# Patient Record
Sex: Female | Born: 1950
Health system: Southern US, Community
[De-identification: ages and names within clinical notes are randomized; demographics above are authoritative.]

## PROBLEM LIST (undated history)

## (undated) DIAGNOSIS — F319 Bipolar disorder, unspecified: Secondary | ICD-10-CM

---

## 2007-11-20 ENCOUNTER — Emergency Department (HOSPITAL_COMMUNITY): Admission: EM | Admit: 2007-11-20 | Discharge: 2007-11-20 | Payer: Self-pay | Admitting: Emergency Medicine

## 2011-09-16 LAB — CBC
Hemoglobin: 14.4
MCHC: 34.4
MCV: 85.3
Platelets: 354
RDW: 12.8

## 2011-09-16 LAB — BASIC METABOLIC PANEL
Chloride: 106
GFR calc Af Amer: >60
GFR calc non Af Amer: >60
Glucose, Bld: 117 — ABNORMAL HIGH
Sodium: 142

## 2012-08-12 ENCOUNTER — Other Ambulatory Visit: Payer: Self-pay | Admitting: Radiology

## 2015-11-09 DIAGNOSIS — Z79899 Other long term (current) drug therapy: Secondary | ICD-10-CM | POA: Diagnosis not present

## 2015-11-21 DIAGNOSIS — S62502A Fracture of unspecified phalanx of left thumb, initial encounter for closed fracture: Secondary | ICD-10-CM | POA: Diagnosis not present

## 2015-12-06 DIAGNOSIS — S62515A Nondisplaced fracture of proximal phalanx of left thumb, initial encounter for closed fracture: Secondary | ICD-10-CM | POA: Diagnosis not present

## 2016-04-03 DIAGNOSIS — J029 Acute pharyngitis, unspecified: Secondary | ICD-10-CM | POA: Diagnosis not present

## 2016-05-29 DIAGNOSIS — M85852 Other specified disorders of bone density and structure, left thigh: Secondary | ICD-10-CM | POA: Diagnosis not present

## 2016-05-29 DIAGNOSIS — Z1231 Encounter for screening mammogram for malignant neoplasm of breast: Secondary | ICD-10-CM | POA: Diagnosis not present

## 2016-05-29 DIAGNOSIS — M810 Age-related osteoporosis without current pathological fracture: Secondary | ICD-10-CM | POA: Diagnosis not present

## 2016-05-29 DIAGNOSIS — M85851 Other specified disorders of bone density and structure, right thigh: Secondary | ICD-10-CM | POA: Diagnosis not present

## 2017-01-10 DIAGNOSIS — H353131 Nonexudative age-related macular degeneration, bilateral, early dry stage: Secondary | ICD-10-CM | POA: Diagnosis not present

## 2017-01-10 DIAGNOSIS — H2513 Age-related nuclear cataract, bilateral: Secondary | ICD-10-CM | POA: Diagnosis not present

## 2017-01-10 DIAGNOSIS — H524 Presbyopia: Secondary | ICD-10-CM | POA: Diagnosis not present

## 2017-03-18 DIAGNOSIS — Z79899 Other long term (current) drug therapy: Secondary | ICD-10-CM | POA: Diagnosis not present

## 2017-05-30 DIAGNOSIS — Z1231 Encounter for screening mammogram for malignant neoplasm of breast: Secondary | ICD-10-CM | POA: Diagnosis not present

## 2017-06-19 DIAGNOSIS — F3173 Bipolar disorder, in partial remission, most recent episode manic: Secondary | ICD-10-CM | POA: Diagnosis not present

## 2017-09-04 DIAGNOSIS — F3173 Bipolar disorder, in partial remission, most recent episode manic: Secondary | ICD-10-CM | POA: Diagnosis not present

## 2017-10-24 DIAGNOSIS — F3174 Bipolar disorder, in full remission, most recent episode manic: Secondary | ICD-10-CM | POA: Diagnosis not present

## 2017-11-13 DIAGNOSIS — F3174 Bipolar disorder, in full remission, most recent episode manic: Secondary | ICD-10-CM | POA: Diagnosis not present

## 2018-01-20 DIAGNOSIS — F3174 Bipolar disorder, in full remission, most recent episode manic: Secondary | ICD-10-CM | POA: Diagnosis not present

## 2018-03-19 DIAGNOSIS — F3174 Bipolar disorder, in full remission, most recent episode manic: Secondary | ICD-10-CM | POA: Diagnosis not present

## 2018-05-14 DIAGNOSIS — Z79899 Other long term (current) drug therapy: Secondary | ICD-10-CM | POA: Diagnosis not present

## 2018-05-19 DIAGNOSIS — F3174 Bipolar disorder, in full remission, most recent episode manic: Secondary | ICD-10-CM | POA: Diagnosis not present

## 2018-07-14 DIAGNOSIS — Z1231 Encounter for screening mammogram for malignant neoplasm of breast: Secondary | ICD-10-CM | POA: Diagnosis not present

## 2018-08-04 DIAGNOSIS — F3174 Bipolar disorder, in full remission, most recent episode manic: Secondary | ICD-10-CM | POA: Diagnosis not present

## 2018-10-01 DIAGNOSIS — F3174 Bipolar disorder, in full remission, most recent episode manic: Secondary | ICD-10-CM | POA: Diagnosis not present

## 2018-10-20 DIAGNOSIS — F3174 Bipolar disorder, in full remission, most recent episode manic: Secondary | ICD-10-CM | POA: Diagnosis not present

## 2018-12-22 ENCOUNTER — Emergency Department (HOSPITAL_COMMUNITY)
Admission: EM | Admit: 2018-12-22 | Discharge: 2018-12-22 | Disposition: A | Discharge status: Home / Self Care | Payer: Medicare Other | Attending: Emergency Medicine | Admitting: Emergency Medicine

## 2018-12-22 ENCOUNTER — Other Ambulatory Visit: Payer: Self-pay

## 2018-12-22 ENCOUNTER — Encounter (HOSPITAL_COMMUNITY): Payer: Self-pay

## 2018-12-22 ENCOUNTER — Emergency Department (HOSPITAL_COMMUNITY): Payer: Medicare Other

## 2018-12-22 DIAGNOSIS — R519 Headache, unspecified: Secondary | ICD-10-CM

## 2018-12-22 DIAGNOSIS — S060X0A Concussion without loss of consciousness, initial encounter: Secondary | ICD-10-CM

## 2018-12-22 DIAGNOSIS — R51 Headache: Secondary | ICD-10-CM | POA: Diagnosis not present

## 2018-12-22 DIAGNOSIS — R42 Dizziness and giddiness: Secondary | ICD-10-CM | POA: Diagnosis present

## 2018-12-22 LAB — CBC
HEMATOCRIT: 46 % (ref 36.0–46.0)
HEMOGLOBIN: 14.4 g/dL (ref 12.0–15.0)
MCH: 28.4 pg (ref 26.0–34.0)
MCHC: 31.3 g/dL (ref 30.0–36.0)
MCV: 90.7 fL (ref 80.0–100.0)
Platelets: 334 10*3/uL (ref 150–400)
RBC: 5.07 MIL/uL (ref 3.87–5.11)
RDW: 12.1 % (ref 11.5–15.5)
WBC: 10.8 10*3/uL — ABNORMAL HIGH (ref 4.0–10.5)
nRBC: 0 % (ref 0.0–0.2)

## 2018-12-22 LAB — BASIC METABOLIC PANEL
Anion gap: 10 (ref 5–15)
BUN: 8 mg/dL (ref 8–23)
CHLORIDE: 104 mmol/L (ref 98–111)
CO2: 24 mmol/L (ref 22–32)
Calcium: 9.3 mg/dL (ref 8.9–10.3)
Creatinine, Ser: 0.91 mg/dL (ref 0.44–1.00)
GFR calc Af Amer: >60 mL/min (ref >60)
GFR calc non Af Amer: >60 mL/min (ref >60)
GLUCOSE: 104 mg/dL — AB (ref 70–99)
POTASSIUM: 4.2 mmol/L (ref 3.5–5.1)
Sodium: 138 mmol/L (ref 135–145)

## 2018-12-22 NOTE — ED Notes (Signed)
Pt verbalized understanding of d/c instructions and has no further questions, VSS, NAD.  

## 2018-12-22 NOTE — ED Provider Notes (Signed)
[facility] EMERGENCY DEPARTMENT Provider Note   CSN: [id number] Arrival date & time: 12/22/18  4098     History   Chief Complaint Chief Complaint  Patient presents with  . Dizziness  . Fall    HPI [facility] is a 68 y.o. female.  HPI   68 year old female for fall, headache and dizziness.  Reports approximately 3 to 4 weeks ago, she had a fall, hitting the back of her head.  Reports that since that time she had continuing headaches.  Approximately 1 week ago, she tripped over a car seat, fell again hitting her head.  Reports she is had worsening headaches are daily, as well as dizziness.  Describes the dizziness as lightheadedness.  Reports she has been trying to drink less, specifically drinking less sodas.  Denies any black or bloody stools, vomiting, diarrhea, cough, chest pain, shortness of breath, numbness, weakness, facial droop, change in vision.  Headache is located in the back of the head with radiation forward.  Is moderate at this time. Improves with tylenol at home. Not worsened by bright lights or loud sounds.   History reviewed. No pertinent past medical history.  There are no active problems to display for this patient.   History reviewed. No pertinent surgical history.   OB History   No obstetric history on file.      Home Medications    Prior to Admission medications   Not on File    Family History History reviewed. No pertinent family history.  Social History Social History   Tobacco Use  . Smoking status: Never Smoker  Substance Use Topics  . Alcohol use: Yes    Comment: occasional wine  . Drug use: Never     Allergies   Patient has no known allergies.   Review of Systems Review of Systems  Constitutional: Negative for fever.  HENT: Negative for sore throat.   Eyes: Negative for visual disturbance.  Respiratory: Negative for cough and shortness of breath.   Cardiovascular: Negative for chest pain.    Gastrointestinal: Negative for abdominal pain, nausea and vomiting.  Genitourinary: Negative for difficulty urinating and dysuria.  Musculoskeletal: Negative for back pain and neck pain.  Skin: Negative for rash.  Neurological: Positive for dizziness, light-headedness and headaches. Negative for syncope, facial asymmetry, speech difficulty, weakness and numbness.     Physical Exam Updated Vital Signs BP 119/70   Pulse 88   Temp 98.2 F (36.8 C) (Oral)   Resp 19   SpO2 93%   Physical Exam Vitals signs and nursing note reviewed.  Constitutional:      General: She is not in acute distress.    Appearance: She is well-developed. She is not diaphoretic.  HENT:     Head: Normocephalic and atraumatic.  Eyes:     General: No visual field deficit.    Conjunctiva/sclera: Conjunctivae normal.  Neck:     Musculoskeletal: Normal range of motion.  Cardiovascular:     Rate and Rhythm: Normal rate and regular rhythm.     Heart sounds: Normal heart sounds. No murmur. No friction rub. No gallop.   Pulmonary:     Effort: Pulmonary effort is normal. No respiratory distress.     Breath sounds: Normal breath sounds. No wheezing or rales.  Abdominal:     General: There is no distension.     Palpations: Abdomen is soft.     Tenderness: There is no abdominal tenderness. There is no guarding.  Musculoskeletal:  General: No tenderness.  Skin:    General: Skin is warm and dry.     Findings: No erythema or rash.  Neurological:     Mental Status: She is alert and oriented to person, place, and time.     GCS: GCS eye subscore is 4. GCS verbal subscore is 5. GCS motor subscore is 6.     Cranial Nerves: No cranial nerve deficit, dysarthria or facial asymmetry.     Sensory: Sensation is intact.     Motor: Motor function is intact. No weakness or pronator drift.     Coordination: Coordination is intact. Coordination normal. Finger-Nose-Finger Test normal.      ED Treatments / Results   Labs (all labs ordered are listed, but only abnormal results are displayed) Labs Reviewed  BASIC METABOLIC PANEL - Abnormal; Notable for the following components:      Result Value   Glucose, Bld 104 (*)    All other components within normal limits  CBC - Abnormal; Notable for the following components:   WBC 10.8 (*)    All other components within normal limits    EKG EKG Interpretation  Date/Time:  Tuesday December 22 2018 09:53:18 EST Ventricular Rate:  97 PR Interval:    QRS Duration: 87 QT Interval:  353 QTC Calculation: 449 R Axis:   -30 Text Interpretation:  Sinus rhythm Left axis deviation Low voltage, precordial leads Abnormal R-wave progression, late transition No previous ECGs available Confirmed by Alvira MondaySchlossman, Adeyemi Hamad ([id number]) on 12/22/2018 1:39:52 PM   Radiology Ct Head Wo Contrast  Result Date: 12/22/2018 CLINICAL DATA:  Headaches since falling 3 months ago. EXAM: CT HEAD WITHOUT CONTRAST TECHNIQUE: Contiguous axial images were obtained from the base of the skull through the vertex without intravenous contrast. COMPARISON:  CT head 11/20/2007. FINDINGS: Brain: There is no evidence of acute intracranial hemorrhage, mass lesion, brain edema or extra-axial fluid collection. The ventricles and subarachnoid spaces are appropriately sized for age. There is no CT evidence of acute cortical infarction. Vascular:  No hyperdense vessel identified. Skull: Negative for fracture or focal lesion. Mild calvarial hyperostosis. Sinuses/Orbits: The visualized paranasal sinuses and mastoid air cells are clear. No orbital abnormalities are seen. Other: None. IMPRESSION: Stable negative noncontrast head CT. Electronically Signed   By: Carey BullocksWilliam  Veazey M.D.   On: 12/22/2018 13:17    Procedures Procedures (including critical care time)  Medications Ordered in ED Medications - No data to display   Initial Impression / Assessment and Plan / ED Course  I have reviewed the triage vital signs and  the nursing notes.  Pertinent labs & imaging results that were available during my care of the patient were reviewed by me and considered in my medical decision making (see chart for details).    68 year old female presents with concern for headache and dizziness following head trauma both 3 to 4 weeks ago 1 week ago.  Regarding dizziness, describes as a lightheadedness.  EKG shows no sign of arrhythmia, no prolonged QTC, or other acute changes.  Labs show no significant electrolyte abnormalities.  No anemia.  Denies acute illness, low suspicion for sepsis as etiology of lightheadedness.  She has a normal neurologic exam, including normal coordination, medical decision for CVA as etiology of dizziness.  Described as lightheadedness that began after she hit her head.  CT head was done which showed no evidence of intracranial bleeding.  Symptoms may be consistent with concussion, and dehydration in the setting of decreased fluid intake may also contribute  to dizziness.  Recommend continued supportive care and follow-up with primary care physician.   Final Clinical Impressions(s) / ED Diagnoses   Final diagnoses:  Acute nonintractable headache, unspecified headache type  Concussion without loss of consciousness, initial encounter    ED Discharge Orders    None       Alvira MondaySchlossman, Brok Stocking, MD 12/22/18 2143

## 2018-12-22 NOTE — ED Triage Notes (Signed)
Pt endorses multiple mechanical falls in the last 2 months, last one was 1 week ago, hit head both times and since pt is having ha's and dizziness. Not on thinners. No neuro deficits. No syncope.

## 2019-01-02 ENCOUNTER — Emergency Department (HOSPITAL_COMMUNITY)
Admission: EM | Admit: 2019-01-02 | Discharge: 2019-01-02 | Disposition: A | Discharge status: Home / Self Care | Payer: Medicare Other | Attending: Emergency Medicine | Admitting: Emergency Medicine

## 2019-01-02 ENCOUNTER — Encounter (HOSPITAL_COMMUNITY): Payer: Self-pay | Admitting: Emergency Medicine

## 2019-01-02 ENCOUNTER — Emergency Department (HOSPITAL_COMMUNITY): Payer: Medicare Other

## 2019-01-02 DIAGNOSIS — Y939 Activity, unspecified: Secondary | ICD-10-CM | POA: Diagnosis not present

## 2019-01-02 DIAGNOSIS — R031 Nonspecific low blood-pressure reading: Secondary | ICD-10-CM | POA: Diagnosis not present

## 2019-01-02 DIAGNOSIS — Y999 Unspecified external cause status: Secondary | ICD-10-CM | POA: Insufficient documentation

## 2019-01-02 DIAGNOSIS — R0789 Other chest pain: Secondary | ICD-10-CM | POA: Diagnosis not present

## 2019-01-02 DIAGNOSIS — S3992XA Unspecified injury of lower back, initial encounter: Secondary | ICD-10-CM | POA: Diagnosis not present

## 2019-01-02 DIAGNOSIS — S299XXA Unspecified injury of thorax, initial encounter: Secondary | ICD-10-CM | POA: Diagnosis not present

## 2019-01-02 DIAGNOSIS — S3991XA Unspecified injury of abdomen, initial encounter: Secondary | ICD-10-CM | POA: Diagnosis not present

## 2019-01-02 DIAGNOSIS — S20211A Contusion of right front wall of thorax, initial encounter: Secondary | ICD-10-CM | POA: Insufficient documentation

## 2019-01-02 DIAGNOSIS — Y929 Unspecified place or not applicable: Secondary | ICD-10-CM | POA: Diagnosis not present

## 2019-01-02 DIAGNOSIS — R52 Pain, unspecified: Secondary | ICD-10-CM | POA: Diagnosis not present

## 2019-01-02 DIAGNOSIS — R079 Chest pain, unspecified: Secondary | ICD-10-CM | POA: Diagnosis not present

## 2019-01-02 DIAGNOSIS — M545 Low back pain: Secondary | ICD-10-CM | POA: Diagnosis not present

## 2019-01-02 HISTORY — DX: Bipolar disorder, unspecified: F31.9

## 2019-01-02 LAB — CBC WITH DIFFERENTIAL/PLATELET
ABS IMMATURE GRANULOCYTES: 0.13 10*3/uL — AB (ref 0.00–0.07)
BASOS PCT: 0 %
Basophils Absolute: 0 10*3/uL (ref 0.0–0.1)
EOS ABS: 0 10*3/uL (ref 0.0–0.5)
Eosinophils Relative: 0 %
HEMATOCRIT: 39.8 % (ref 36.0–46.0)
Hemoglobin: 12.5 g/dL (ref 12.0–15.0)
IMMATURE GRANULOCYTES: 1 %
Lymphocytes Relative: 7 %
Lymphs Abs: 1.2 10*3/uL (ref 0.7–4.0)
MCH: 29.2 pg (ref 26.0–34.0)
MCHC: 31.4 g/dL (ref 30.0–36.0)
MCV: 93 fL (ref 80.0–100.0)
Monocytes Absolute: 1.1 10*3/uL — ABNORMAL HIGH (ref 0.1–1.0)
Monocytes Relative: 6 %
NEUTROS ABS: 15.2 10*3/uL — AB (ref 1.7–7.7)
NEUTROS PCT: 86 %
PLATELETS: 252 10*3/uL (ref 150–400)
RBC: 4.28 MIL/uL (ref 3.87–5.11)
RDW: 12.1 % (ref 11.5–15.5)
WBC: 17.6 10*3/uL — AB (ref 4.0–10.5)
nRBC: 0 % (ref 0.0–0.2)

## 2019-01-02 LAB — COMPREHENSIVE METABOLIC PANEL
ALT: 37 U/L (ref 0–44)
ANION GAP: 9 (ref 5–15)
AST: 79 U/L — ABNORMAL HIGH (ref 15–41)
Albumin: 3.1 g/dL — ABNORMAL LOW (ref 3.5–5.0)
Alkaline Phosphatase: 83 U/L (ref 38–126)
BILIRUBIN TOTAL: 0.5 mg/dL (ref 0.3–1.2)
BUN: 6 mg/dL — ABNORMAL LOW (ref 8–23)
CHLORIDE: 103 mmol/L (ref 98–111)
CO2: 24 mmol/L (ref 22–32)
Calcium: 8.5 mg/dL — ABNORMAL LOW (ref 8.9–10.3)
Creatinine, Ser: 0.89 mg/dL (ref 0.44–1.00)
Glucose, Bld: 149 mg/dL — ABNORMAL HIGH (ref 70–99)
Potassium: 3.7 mmol/L (ref 3.5–5.1)
Sodium: 136 mmol/L (ref 135–145)
TOTAL PROTEIN: 6 g/dL — AB (ref 6.5–8.1)

## 2019-01-02 LAB — LIPASE, BLOOD: Lipase: 29 U/L (ref 11–51)

## 2019-01-02 LAB — I-STAT CREATININE, ED: CREATININE: 0.8 mg/dL (ref 0.44–1.00)

## 2019-01-02 MED ORDER — MORPHINE SULFATE 15 MG PO TABS: 15.0000 mg | ORAL_TABLET | ORAL | 0 refills | Status: AC | PRN

## 2019-01-02 MED ORDER — DICLOFENAC SODIUM 1 % TD GEL
4.0000 g | Freq: Once | TRANSDERMAL | Status: AC
  Administered 2019-01-02: 4 g via TOPICAL
  Filled 2019-01-02: qty 100

## 2019-01-02 MED ORDER — SODIUM CHLORIDE 0.9 % IV BOLUS
1000.0000 mL | Freq: Once | INTRAVENOUS | Status: AC
  Administered 2019-01-02: 1000 mL via INTRAVENOUS

## 2019-01-02 MED ORDER — IOHEXOL 300 MG/ML  SOLN
100.0000 mL | Freq: Once | INTRAMUSCULAR | Status: AC | PRN
  Administered 2019-01-02: 100 mL via INTRAVENOUS

## 2019-01-02 MED ORDER — DICLOFENAC SODIUM 1 % TD GEL: 4.0000 g | Freq: Four times a day (QID) | TRANSDERMAL | 0 refills | Status: AC

## 2019-01-02 MED ORDER — ACETAMINOPHEN 500 MG PO TABS
1000.0000 mg | ORAL_TABLET | Freq: Once | ORAL | Status: AC
  Administered 2019-01-02: 1000 mg via ORAL
  Filled 2019-01-02: qty 2

## 2019-01-02 NOTE — Discharge Instructions (Signed)
Take tylenol 1000mg(2 extra strength) four times a day.  ° °Then take the pain medicine if you feel like you need it. Narcotics do not help with the pain, they only make you care about it less.  You can become addicted to this, people may break into your house to steal it.  It will constipate you.  If you drive under the influence of this medicine you can get a DUI.   ° °
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
## 2019-01-02 NOTE — ED Notes (Signed)
Patient ambulated in the hallway with no new concerns. Patient was steady on her feet while ambulating.

## 2019-01-02 NOTE — ED Notes (Signed)
Pt verbalized understanding of d/c instructions and has no further questions, VSS, NAD. Pt instructed as to how to use voltaren gel and mophine.

## 2019-01-02 NOTE — ED Notes (Signed)
Pt's BP 86/64, Dr Adela Lank notified and will order CT scans for pt. Pt placed back in gown and moved into Resus Room.

## 2019-01-02 NOTE — ED Notes (Signed)
Pt in restroom sitting up on commode trying to put clothes on. Pt became dizzy and weak. Pt assisted back to stretcher. BP 90s/50s. Dr Adela LankFloyd notified and at bedside. Pt given 500ml NS bolus by this RN.

## 2019-01-02 NOTE — ED Notes (Signed)
 NS bolus complete and pt feels better, less dizzy.
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
## 2019-01-12 DIAGNOSIS — N6489 Other specified disorders of breast: Secondary | ICD-10-CM | POA: Diagnosis not present

## 2019-01-27 DIAGNOSIS — F3174 Bipolar disorder, in full remission, most recent episode manic: Secondary | ICD-10-CM | POA: Diagnosis not present

## 2019-02-02 DIAGNOSIS — F3174 Bipolar disorder, in full remission, most recent episode manic: Secondary | ICD-10-CM | POA: Diagnosis not present

## 2019-02-09 DIAGNOSIS — F3174 Bipolar disorder, in full remission, most recent episode manic: Secondary | ICD-10-CM | POA: Diagnosis not present

## 2019-02-19 DIAGNOSIS — M546 Pain in thoracic spine: Secondary | ICD-10-CM | POA: Diagnosis not present

## 2019-04-27 DIAGNOSIS — H524 Presbyopia: Secondary | ICD-10-CM | POA: Diagnosis not present

## 2019-04-27 DIAGNOSIS — H353132 Nonexudative age-related macular degeneration, bilateral, intermediate dry stage: Secondary | ICD-10-CM | POA: Diagnosis not present

## 2019-04-27 DIAGNOSIS — H2513 Age-related nuclear cataract, bilateral: Secondary | ICD-10-CM | POA: Diagnosis not present
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
## 2019-05-11 DIAGNOSIS — F3174 Bipolar disorder, in full remission, most recent episode manic: Secondary | ICD-10-CM | POA: Diagnosis not present

## 2019-06-24 DIAGNOSIS — Z79899 Other long term (current) drug therapy: Secondary | ICD-10-CM | POA: Diagnosis not present

## 2019-07-30 DIAGNOSIS — F3174 Bipolar disorder, in full remission, most recent episode manic: Secondary | ICD-10-CM | POA: Diagnosis not present

## 2019-10-01 DIAGNOSIS — F3174 Bipolar disorder, in full remission, most recent episode manic: Secondary | ICD-10-CM | POA: Diagnosis not present

## 2019-10-29 DIAGNOSIS — H35373 Puckering of macula, bilateral: Secondary | ICD-10-CM | POA: Diagnosis not present

## 2019-12-14 DIAGNOSIS — F3176 Bipolar disorder, in full remission, most recent episode depressed: Secondary | ICD-10-CM | POA: Diagnosis not present

## 2020-01-18 ENCOUNTER — Telehealth: Payer: Self-pay

## 2020-01-18 NOTE — Telephone Encounter (Signed)
Per initial encounter, "Pt returning call for vaccine app. Please call pt back. Thanks!"; contacted pt to schedule appt; the pt says that she was contacted pm 01/17/20; she would like a Friday afternoon appt; pt offered and accepted appt at [facility] 01/22/20 at 1415; she verbalized understanding.

## 2020-01-22 ENCOUNTER — Ambulatory Visit

## 2020-01-28 ENCOUNTER — Ambulatory Visit

## 2020-01-30 ENCOUNTER — Ambulatory Visit: Payer: Medicare Other | Attending: Internal Medicine

## 2020-01-30 DIAGNOSIS — Z23 Encounter for immunization: Secondary | ICD-10-CM | POA: Insufficient documentation

## 2020-01-30 NOTE — Progress Notes (Signed)
   Covid-19 Vaccination Clinic  Name:  Icis Budreau    MRN: [id number] DOB: 12-18-1950  01/30/2020  Ms. Bruschi was observed post Covid-19 immunization for 15 minutes without incidence. She was provided with Vaccine Information Sheet and instruction to access the V-Safe system.   Ms. Buckingham was instructed to call 911 with any severe reactions post vaccine: Marland Kitchen Difficulty breathing  . Swelling of your face and throat  . A fast heartbeat  . A bad rash all over your body  . Dizziness and weakness    Immunizations Administered    Name Date Dose VIS Date Route   Pfizer COVID-19 Vaccine 01/30/2020  9:22 AM 0.3 mL 11/19/2019 Intramuscular   Manufacturer: [employer], [employer]   Lot: [id number]   NDC: [address]-8

## 2020-02-29 ENCOUNTER — Ambulatory Visit: Payer: Medicare Other | Attending: Internal Medicine

## 2020-02-29 DIAGNOSIS — Z23 Encounter for immunization: Secondary | ICD-10-CM

## 2020-02-29 NOTE — Progress Notes (Signed)
   Covid-19 Vaccination Clinic  Name:  [facility]    MRN: [id number] DOB: 09-17-1951  02/29/2020  [facility] was observed post Covid-19 immunization for 15 minutes without incident. She was provided with Vaccine Information Sheet and instruction to access the V-Safe system.   [facility] was instructed to call 911 with any severe reactions post vaccine: Marland Kitchen Difficulty breathing  . Swelling of face and throat  . A fast heartbeat  . A bad rash all over body  . Dizziness and weakness   Immunizations Administered    Name Date Dose VIS Date Route   Pfizer COVID-19 Vaccine 02/29/2020  4:45 PM 0.3 mL 11/19/2019 Intramuscular   Manufacturer: [employer], [employer]   Lot: [id number]   NDC: [address]-6

## 2020-03-17 DIAGNOSIS — F3176 Bipolar disorder, in full remission, most recent episode depressed: Secondary | ICD-10-CM | POA: Diagnosis not present

## 2020-05-10 DIAGNOSIS — F3176 Bipolar disorder, in full remission, most recent episode depressed: Secondary | ICD-10-CM | POA: Diagnosis not present

## 2020-05-15 DIAGNOSIS — H35371 Puckering of macula, right eye: Secondary | ICD-10-CM | POA: Diagnosis not present

## 2020-05-15 DIAGNOSIS — H2513 Age-related nuclear cataract, bilateral: Secondary | ICD-10-CM | POA: Diagnosis not present

## 2020-06-13 DIAGNOSIS — F3176 Bipolar disorder, in full remission, most recent episode depressed: Secondary | ICD-10-CM | POA: Diagnosis not present

## 2020-06-23 DIAGNOSIS — Z79899 Other long term (current) drug therapy: Secondary | ICD-10-CM | POA: Diagnosis not present

## 2020-07-11 DIAGNOSIS — F3176 Bipolar disorder, in full remission, most recent episode depressed: Secondary | ICD-10-CM | POA: Diagnosis not present

## 2020-08-22 DIAGNOSIS — F3176 Bipolar disorder, in full remission, most recent episode depressed: Secondary | ICD-10-CM | POA: Diagnosis not present

## 2020-10-25 DIAGNOSIS — F3176 Bipolar disorder, in full remission, most recent episode depressed: Secondary | ICD-10-CM | POA: Diagnosis not present

## 2020-11-23 DIAGNOSIS — H2513 Age-related nuclear cataract, bilateral: Secondary | ICD-10-CM | POA: Diagnosis not present

## 2020-11-23 DIAGNOSIS — H35371 Puckering of macula, right eye: Secondary | ICD-10-CM | POA: Diagnosis not present

## 2020-12-22 DIAGNOSIS — F3176 Bipolar disorder, in full remission, most recent episode depressed: Secondary | ICD-10-CM | POA: Diagnosis not present

## 2021-03-01 DIAGNOSIS — F3175 Bipolar disorder, in partial remission, most recent episode depressed: Secondary | ICD-10-CM | POA: Diagnosis not present

## 2021-03-01 DIAGNOSIS — F3173 Bipolar disorder, in partial remission, most recent episode manic: Secondary | ICD-10-CM | POA: Diagnosis not present

## 2021-03-01 DIAGNOSIS — F3174 Bipolar disorder, in full remission, most recent episode manic: Secondary | ICD-10-CM | POA: Diagnosis not present

## 2021-03-01 DIAGNOSIS — F3176 Bipolar disorder, in full remission, most recent episode depressed: Secondary | ICD-10-CM | POA: Diagnosis not present

## 2021-05-01 DIAGNOSIS — F3176 Bipolar disorder, in full remission, most recent episode depressed: Secondary | ICD-10-CM | POA: Diagnosis not present

## 2021-05-01 DIAGNOSIS — F3174 Bipolar disorder, in full remission, most recent episode manic: Secondary | ICD-10-CM | POA: Diagnosis not present

## 2021-05-01 DIAGNOSIS — F3175 Bipolar disorder, in partial remission, most recent episode depressed: Secondary | ICD-10-CM | POA: Diagnosis not present

## 2021-05-25 DIAGNOSIS — H2513 Age-related nuclear cataract, bilateral: Secondary | ICD-10-CM | POA: Diagnosis not present

## 2021-05-25 DIAGNOSIS — H5203 Hypermetropia, bilateral: Secondary | ICD-10-CM | POA: Diagnosis not present

## 2021-06-25 DIAGNOSIS — F3174 Bipolar disorder, in full remission, most recent episode manic: Secondary | ICD-10-CM | POA: Diagnosis not present

## 2021-06-25 DIAGNOSIS — F3176 Bipolar disorder, in full remission, most recent episode depressed: Secondary | ICD-10-CM | POA: Diagnosis not present

## 2021-06-25 DIAGNOSIS — F3175 Bipolar disorder, in partial remission, most recent episode depressed: Secondary | ICD-10-CM | POA: Diagnosis not present

## 2021-07-10 DIAGNOSIS — Z79899 Other long term (current) drug therapy: Secondary | ICD-10-CM | POA: Diagnosis not present

## 2021-08-27 DIAGNOSIS — F3176 Bipolar disorder, in full remission, most recent episode depressed: Secondary | ICD-10-CM | POA: Diagnosis not present

## 2021-08-27 DIAGNOSIS — F3175 Bipolar disorder, in partial remission, most recent episode depressed: Secondary | ICD-10-CM | POA: Diagnosis not present

## 2021-08-27 DIAGNOSIS — F3174 Bipolar disorder, in full remission, most recent episode manic: Secondary | ICD-10-CM | POA: Diagnosis not present

## 2021-09-11 DIAGNOSIS — Z1231 Encounter for screening mammogram for malignant neoplasm of breast: Secondary | ICD-10-CM | POA: Diagnosis not present

## 2021-09-24 DIAGNOSIS — R921 Mammographic calcification found on diagnostic imaging of breast: Secondary | ICD-10-CM | POA: Diagnosis not present

## 2021-09-24 DIAGNOSIS — R928 Other abnormal and inconclusive findings on diagnostic imaging of breast: Secondary | ICD-10-CM | POA: Diagnosis not present

## 2021-10-24 DIAGNOSIS — F3174 Bipolar disorder, in full remission, most recent episode manic: Secondary | ICD-10-CM | POA: Diagnosis not present

## 2021-10-24 DIAGNOSIS — F3175 Bipolar disorder, in partial remission, most recent episode depressed: Secondary | ICD-10-CM | POA: Diagnosis not present

## 2021-10-24 DIAGNOSIS — F3176 Bipolar disorder, in full remission, most recent episode depressed: Secondary | ICD-10-CM | POA: Diagnosis not present

## 2021-12-19 DIAGNOSIS — F3174 Bipolar disorder, in full remission, most recent episode manic: Secondary | ICD-10-CM | POA: Diagnosis not present

## 2021-12-19 DIAGNOSIS — F3176 Bipolar disorder, in full remission, most recent episode depressed: Secondary | ICD-10-CM | POA: Diagnosis not present

## 2021-12-19 DIAGNOSIS — F3175 Bipolar disorder, in partial remission, most recent episode depressed: Secondary | ICD-10-CM | POA: Diagnosis not present

## 2021-12-20 DIAGNOSIS — Z0000 Encounter for general adult medical examination without abnormal findings: Secondary | ICD-10-CM | POA: Diagnosis not present

## 2021-12-20 DIAGNOSIS — F3177 Bipolar disorder, in partial remission, most recent episode mixed: Secondary | ICD-10-CM | POA: Diagnosis not present

## 2021-12-20 DIAGNOSIS — Z780 Asymptomatic menopausal state: Secondary | ICD-10-CM | POA: Diagnosis not present

## 2021-12-20 DIAGNOSIS — Z136 Encounter for screening for cardiovascular disorders: Secondary | ICD-10-CM | POA: Diagnosis not present

## 2021-12-20 DIAGNOSIS — Z9012 Acquired absence of left breast and nipple: Secondary | ICD-10-CM | POA: Diagnosis not present

## 2021-12-20 DIAGNOSIS — Z808 Family history of malignant neoplasm of other organs or systems: Secondary | ICD-10-CM | POA: Diagnosis not present

## 2021-12-31 ENCOUNTER — Other Ambulatory Visit: Payer: Self-pay | Admitting: Family Medicine

## 2021-12-31 DIAGNOSIS — Z780 Asymptomatic menopausal state: Secondary | ICD-10-CM

## 2022-02-13 DIAGNOSIS — F3176 Bipolar disorder, in full remission, most recent episode depressed: Secondary | ICD-10-CM | POA: Diagnosis not present

## 2022-02-13 DIAGNOSIS — F3175 Bipolar disorder, in partial remission, most recent episode depressed: Secondary | ICD-10-CM | POA: Diagnosis not present

## 2022-02-13 DIAGNOSIS — F3174 Bipolar disorder, in full remission, most recent episode manic: Secondary | ICD-10-CM | POA: Diagnosis not present

## 2022-02-26 DIAGNOSIS — R051 Acute cough: Secondary | ICD-10-CM | POA: Diagnosis not present

## 2022-03-18 ENCOUNTER — Ambulatory Visit (HOSPITAL_COMMUNITY)
Admission: RE | Admit: 2022-03-18 | Discharge: 2022-03-18 | Disposition: A | Discharge status: Home / Self Care | Payer: Medicare Other | Attending: Psychiatry | Admitting: Psychiatry

## 2022-03-18 NOTE — H&P (Signed)
Behavioral Health Medical Screening Exam ? ?Adriana Edwards is a 71 y.o. female widow who presented voluntarily as a walk-in accompanied by one of her grand daughter to [facility] for evaluation for worsening loneliness and anxiety. Patient has a past psychiatric history of Bipolar I. Patient lives alone and and receives frequent visits from grandchildren and great grandchildren. ? ?Patient reported that she has been experiencing loneliness and anxiety for the past 3 to 4 weeks in the context of failure to take her prescribed Aripiprazole and Lamotrigine x 2 months and missing her husband who had been deceased for the past 23 years. Patient endorsed anxiety and rated as "8" and depression and rated as "3" on a scale of 0 to 10. Endorsed good appetite and sleeping for at least 4 to 5 hours per night. Reported auditory hallucination like hearing things like voices trying to hurt her, specifically sending laser beam to hit her. Denied suicidal ideation, homicidal ideation, or visual hallucination. Denied self injurious behavior now or in the past. Denied any alcohol, drug or tobacco use or dependence. Currently seeing Dr. Dicky Doe, a psychiatrist at the [facility] who manages her medications. At presentation patient still had her full bottle of her 30-day prescribed medications from January 2023. Denied family history of mental health illness, history of trauma or abuse, access to fire arms at home. Denied self isolation, crying spells, hopelessness, worthlessness and anhedonia. ? ?On assessment today, patient was examined in the assessment room siting on a chair. Chart reviewed and findings shared with the treatment team and discussed with Dr. Lucianne Muss. On encounter, patient is alert and oriented to person, place, time and situation. Fairly maintained eye contact with the provider. Affect  appropriate and congruent and mood anxious and somehow depressed. Thought process coherent and linear and thought content logical and within normal  limit. Speech with normal pattern and volume. Shy when discussed the medications she was not taking. Granddaughter stated that patient does not like to take her medications, however, added that she will check on patient frequently to make sure that she takes her medications as ordered. Encouraged to take her medications as ordered by her physician and to scheduled an appointment with the psychiatrist and therapist to discuss her concerns. As per my evaluation, patient did not meet the criteria for psychiatric in-patient admission and above recommendations and emergency numbers for community crises centers were provided to patient and grand daughter. They left the [facility] without any incident. ? ?Total Time spent with patient: 45 minutes ? ?Psychiatric Specialty Exam: ? ?Presentation  ?General Appearance: Appropriate for Environment; Casual; Fairly Groomed ? ?Eye Contact:Fair ? ?Speech:Clear and Coherent; Normal Rate ? ?Speech Volume:Normal ? ?Handedness:Right ? ?Mood and Affect  ?Mood:Anxious; Depressed ? ?Affect:Appropriate; Congruent ? ?Thought Process  ?Thought Processes:Coherent; Linear ? ?Descriptions of Associations:Intact ? ?Orientation:Full (Time, Place and Person) ? ?Thought Content:Logical; WDL ? ?History of Schizophrenia/Schizoaffective disorder:No data recorded ?Duration of Psychotic Symptoms:No data recorded ?Hallucinations:Hallucinations: Auditory ?Description of Auditory Hallucinations: Hearing voices trying to hurt me by sending and laser towards me. ? ?Ideas of Reference:Paranoia ? ?Suicidal Thoughts:Suicidal Thoughts: No ? ?Homicidal Thoughts:Homicidal Thoughts: No ? ?Sensorium  ?Memory:Immediate Fair; Recent Fair; Remote Fair ? ?Judgment:Fair ? ?Insight:Fair ? ?Executive Functions  ?Concentration:Fair ? ?Attention Span:Fair ? ?Recall:Fair ? ?Fund of Knowledge:Fair ? ?Language:Good ? ?Psychomotor Activity  ?Psychomotor Activity:Psychomotor Activity: Normal ? ?Assets  ?Assets:Communication Skills;  Physical Health; Social Support ? ?Sleep  ?Sleep:Sleep: Fair ?Number of Hours of Sleep: 4 ? ?Physical Exam: ?Physical Exam ?Vitals reviewed.  ?Constitutional:   ?  Appearance: Normal appearance.  ?HENT:  ?   Head: Normocephalic and atraumatic.  ?   Right Ear: External ear normal.  ?   Left Ear: External ear normal.  ?   Nose: Nose normal.  ?   Mouth/Throat:  ?   Mouth: Mucous membranes are moist.  ?   Pharynx: Oropharynx is clear.  ?Eyes:  ?   Extraocular Movements: Extraocular movements intact.  ?   Conjunctiva/sclera: Conjunctivae normal.  ?   Pupils: Pupils are equal, round, and reactive to light.  ?Cardiovascular:  ?   Rate and Rhythm: Normal rate.  ?   Pulses: Normal pulses.  ?Pulmonary:  ?   Effort: Pulmonary effort is normal.  ?Abdominal:  ?   Palpations: Abdomen is soft.  ?Genitourinary: ?   Comments: Deferred ?Musculoskeletal:     ?   General: Normal range of motion.  ?   Cervical back: Normal range of motion and neck supple.  ?Skin: ?   General: Skin is warm.  ?Neurological:  ?   General: No focal deficit present.  ?   Mental Status: She is alert and oriented to person, place, and time.  ?Psychiatric:     ?   Behavior: Behavior normal.  ? ?Review of Systems  ?Constitutional: Negative.  Negative for chills and fever.  ?HENT: Negative.  Negative for hearing loss and tinnitus.   ?Eyes: Negative.  Negative for blurred vision.  ?Respiratory: Negative.  Negative for cough, sputum production, shortness of breath and wheezing.   ?Cardiovascular: Negative.  Negative for chest pain and palpitations.  ?Gastrointestinal: Negative.  Negative for abdominal pain, constipation, diarrhea, heartburn and vomiting.  ?Genitourinary: Negative.  Negative for dysuria, frequency and urgency.  ?Musculoskeletal: Negative.   ?Skin: Negative.  Negative for itching and rash.  ?Neurological: Negative.  Negative for dizziness, tingling, tremors, sensory change, speech change, focal weakness, seizures, loss of consciousness, weakness and  headaches.  ?Endo/Heme/Allergies: Negative.  Negative for environmental allergies and polydipsia. Does not bruise/bleed easily.  ?Psychiatric/Behavioral:  Positive for depression (Bipolar 1 Disorder). The patient is nervous/anxious.   ?Blood pressure (!) 143/88, pulse (!) 120, temperature 98.2 ?F (36.8 ?C), temperature source Oral, resp. rate 18, SpO2 98 %. There is no height or weight on file to calculate BMI. ? ?Musculoskeletal: ?Strength & Muscle Tone: within normal limits ?Gait & Station: normal ?Patient leans: N/A ? ?Recommendations: ? ?Based on my evaluation the patient does not appear to have an emergency medical condition. ? ?Cecilie Lowers, FNP ?03/18/2022, 4:27 PM ? ?

## 2022-03-19 DIAGNOSIS — F3189 Other bipolar disorder: Secondary | ICD-10-CM | POA: Diagnosis not present

## 2022-03-19 DIAGNOSIS — F315 Bipolar disorder, current episode depressed, severe, with psychotic features: Secondary | ICD-10-CM | POA: Diagnosis present

## 2022-03-19 DIAGNOSIS — Z79899 Other long term (current) drug therapy: Secondary | ICD-10-CM | POA: Diagnosis not present

## 2022-03-19 DIAGNOSIS — F3174 Bipolar disorder, in full remission, most recent episode manic: Secondary | ICD-10-CM | POA: Diagnosis not present

## 2022-03-19 DIAGNOSIS — Z20822 Contact with and (suspected) exposure to covid-19: Secondary | ICD-10-CM | POA: Diagnosis not present

## 2022-03-19 DIAGNOSIS — F3175 Bipolar disorder, in partial remission, most recent episode depressed: Secondary | ICD-10-CM | POA: Diagnosis not present

## 2022-03-19 DIAGNOSIS — Z91148 Patient's other noncompliance with medication regimen for other reason: Secondary | ICD-10-CM | POA: Diagnosis not present

## 2022-03-19 DIAGNOSIS — F3176 Bipolar disorder, in full remission, most recent episode depressed: Secondary | ICD-10-CM | POA: Diagnosis not present

## 2022-03-26 DIAGNOSIS — R921 Mammographic calcification found on diagnostic imaging of breast: Secondary | ICD-10-CM | POA: Diagnosis not present

## 2022-03-26 DIAGNOSIS — M85852 Other specified disorders of bone density and structure, left thigh: Secondary | ICD-10-CM | POA: Diagnosis not present

## 2022-03-26 DIAGNOSIS — M85851 Other specified disorders of bone density and structure, right thigh: Secondary | ICD-10-CM | POA: Diagnosis not present

## 2022-03-26 DIAGNOSIS — M810 Age-related osteoporosis without current pathological fracture: Secondary | ICD-10-CM | POA: Diagnosis not present

## 2022-04-02 DIAGNOSIS — F3174 Bipolar disorder, in full remission, most recent episode manic: Secondary | ICD-10-CM | POA: Diagnosis not present

## 2022-04-03 ENCOUNTER — Other Ambulatory Visit: Payer: Self-pay | Admitting: Radiology

## 2022-04-03 DIAGNOSIS — N641 Fat necrosis of breast: Secondary | ICD-10-CM | POA: Diagnosis not present

## 2022-04-03 DIAGNOSIS — R921 Mammographic calcification found on diagnostic imaging of breast: Secondary | ICD-10-CM | POA: Diagnosis not present

## 2022-04-17 DIAGNOSIS — F3174 Bipolar disorder, in full remission, most recent episode manic: Secondary | ICD-10-CM | POA: Diagnosis not present

## 2022-05-09 DIAGNOSIS — F3174 Bipolar disorder, in full remission, most recent episode manic: Secondary | ICD-10-CM | POA: Diagnosis not present

## 2022-05-16 DIAGNOSIS — F3174 Bipolar disorder, in full remission, most recent episode manic: Secondary | ICD-10-CM | POA: Diagnosis not present

## 2022-05-27 DIAGNOSIS — H2513 Age-related nuclear cataract, bilateral: Secondary | ICD-10-CM | POA: Diagnosis not present

## 2022-05-27 DIAGNOSIS — H52203 Unspecified astigmatism, bilateral: Secondary | ICD-10-CM | POA: Diagnosis not present

## 2022-05-27 DIAGNOSIS — H353132 Nonexudative age-related macular degeneration, bilateral, intermediate dry stage: Secondary | ICD-10-CM | POA: Diagnosis not present

## 2022-07-11 DIAGNOSIS — F3174 Bipolar disorder, in full remission, most recent episode manic: Secondary | ICD-10-CM | POA: Diagnosis not present

## 2022-08-09 DIAGNOSIS — F3174 Bipolar disorder, in full remission, most recent episode manic: Secondary | ICD-10-CM | POA: Diagnosis not present

## 2022-09-05 DIAGNOSIS — F3174 Bipolar disorder, in full remission, most recent episode manic: Secondary | ICD-10-CM | POA: Diagnosis not present

## 2022-10-03 DIAGNOSIS — F3174 Bipolar disorder, in full remission, most recent episode manic: Secondary | ICD-10-CM | POA: Diagnosis not present

## 2022-10-07 DIAGNOSIS — N6312 Unspecified lump in the right breast, upper inner quadrant: Secondary | ICD-10-CM | POA: Diagnosis not present

## 2022-10-07 DIAGNOSIS — N6311 Unspecified lump in the right breast, upper outer quadrant: Secondary | ICD-10-CM | POA: Diagnosis not present

## 2022-10-07 DIAGNOSIS — R92313 Mammographic fatty tissue density, bilateral breasts: Secondary | ICD-10-CM | POA: Diagnosis not present

## 2022-10-14 DIAGNOSIS — Z79899 Other long term (current) drug therapy: Secondary | ICD-10-CM | POA: Diagnosis not present

## 2022-10-16 ENCOUNTER — Other Ambulatory Visit: Payer: Self-pay | Admitting: Radiology

## 2022-10-16 DIAGNOSIS — N6312 Unspecified lump in the right breast, upper inner quadrant: Secondary | ICD-10-CM | POA: Diagnosis not present

## 2022-10-16 DIAGNOSIS — N6011 Diffuse cystic mastopathy of right breast: Secondary | ICD-10-CM | POA: Diagnosis not present

## 2022-11-04 DIAGNOSIS — F3174 Bipolar disorder, in full remission, most recent episode manic: Secondary | ICD-10-CM | POA: Diagnosis not present

## 2022-12-05 DIAGNOSIS — F3174 Bipolar disorder, in full remission, most recent episode manic: Secondary | ICD-10-CM | POA: Diagnosis not present

## 2022-12-25 DIAGNOSIS — Z6826 Body mass index (BMI) 26.0-26.9, adult: Secondary | ICD-10-CM | POA: Diagnosis not present

## 2022-12-25 DIAGNOSIS — R051 Acute cough: Secondary | ICD-10-CM | POA: Diagnosis not present

## 2022-12-25 DIAGNOSIS — F3177 Bipolar disorder, in partial remission, most recent episode mixed: Secondary | ICD-10-CM | POA: Diagnosis not present

## 2022-12-25 DIAGNOSIS — Z79899 Other long term (current) drug therapy: Secondary | ICD-10-CM | POA: Diagnosis not present

## 2022-12-25 DIAGNOSIS — Z9012 Acquired absence of left breast and nipple: Secondary | ICD-10-CM | POA: Diagnosis not present

## 2022-12-25 DIAGNOSIS — Z0000 Encounter for general adult medical examination without abnormal findings: Secondary | ICD-10-CM | POA: Diagnosis not present

## 2022-12-25 DIAGNOSIS — Z808 Family history of malignant neoplasm of other organs or systems: Secondary | ICD-10-CM | POA: Diagnosis not present

## 2022-12-25 DIAGNOSIS — Z780 Asymptomatic menopausal state: Secondary | ICD-10-CM | POA: Diagnosis not present

## 2023-01-02 DIAGNOSIS — F312 Bipolar disorder, current episode manic severe with psychotic features: Secondary | ICD-10-CM | POA: Diagnosis not present

## 2023-01-30 DIAGNOSIS — F312 Bipolar disorder, current episode manic severe with psychotic features: Secondary | ICD-10-CM | POA: Diagnosis not present

## 2023-02-06 DIAGNOSIS — F312 Bipolar disorder, current episode manic severe with psychotic features: Secondary | ICD-10-CM | POA: Diagnosis not present

## 2023-02-27 DIAGNOSIS — F312 Bipolar disorder, current episode manic severe with psychotic features: Secondary | ICD-10-CM | POA: Diagnosis not present

## 2023-03-18 DIAGNOSIS — R5383 Other fatigue: Secondary | ICD-10-CM | POA: Diagnosis not present

## 2023-03-18 DIAGNOSIS — R52 Pain, unspecified: Secondary | ICD-10-CM | POA: Diagnosis not present

## 2023-03-18 DIAGNOSIS — R509 Fever, unspecified: Secondary | ICD-10-CM | POA: Diagnosis not present

## 2023-03-18 DIAGNOSIS — U071 COVID-19: Secondary | ICD-10-CM | POA: Diagnosis not present

## 2023-03-18 DIAGNOSIS — R051 Acute cough: Secondary | ICD-10-CM | POA: Diagnosis not present

## 2023-03-18 DIAGNOSIS — J029 Acute pharyngitis, unspecified: Secondary | ICD-10-CM | POA: Diagnosis not present

## 2023-03-24 DIAGNOSIS — Z6825 Body mass index (BMI) 25.0-25.9, adult: Secondary | ICD-10-CM | POA: Diagnosis not present

## 2023-03-24 DIAGNOSIS — H7292 Unspecified perforation of tympanic membrane, left ear: Secondary | ICD-10-CM | POA: Diagnosis not present

## 2023-03-24 DIAGNOSIS — H6692 Otitis media, unspecified, left ear: Secondary | ICD-10-CM | POA: Diagnosis not present

## 2023-03-24 DIAGNOSIS — H9202 Otalgia, left ear: Secondary | ICD-10-CM | POA: Diagnosis not present

## 2023-03-31 DIAGNOSIS — F312 Bipolar disorder, current episode manic severe with psychotic features: Secondary | ICD-10-CM | POA: Diagnosis not present

## 2023-04-28 DIAGNOSIS — F312 Bipolar disorder, current episode manic severe with psychotic features: Secondary | ICD-10-CM | POA: Diagnosis not present

## 2023-05-29 DIAGNOSIS — F312 Bipolar disorder, current episode manic severe with psychotic features: Secondary | ICD-10-CM | POA: Diagnosis not present

## 2023-06-26 DIAGNOSIS — F312 Bipolar disorder, current episode manic severe with psychotic features: Secondary | ICD-10-CM | POA: Diagnosis not present

## 2023-07-04 DIAGNOSIS — H5203 Hypermetropia, bilateral: Secondary | ICD-10-CM | POA: Diagnosis not present

## 2023-07-04 DIAGNOSIS — H2513 Age-related nuclear cataract, bilateral: Secondary | ICD-10-CM | POA: Diagnosis not present

## 2023-07-04 DIAGNOSIS — H35371 Puckering of macula, right eye: Secondary | ICD-10-CM | POA: Diagnosis not present

## 2023-07-22 DIAGNOSIS — F312 Bipolar disorder, current episode manic severe with psychotic features: Secondary | ICD-10-CM | POA: Diagnosis not present

## 2023-08-19 DIAGNOSIS — F312 Bipolar disorder, current episode manic severe with psychotic features: Secondary | ICD-10-CM | POA: Diagnosis not present

## 2023-09-02 DIAGNOSIS — H35371 Puckering of macula, right eye: Secondary | ICD-10-CM | POA: Diagnosis not present

## 2023-09-16 DIAGNOSIS — F312 Bipolar disorder, current episode manic severe with psychotic features: Secondary | ICD-10-CM | POA: Diagnosis not present

## 2023-10-14 DIAGNOSIS — F312 Bipolar disorder, current episode manic severe with psychotic features: Secondary | ICD-10-CM | POA: Diagnosis not present

## 2023-10-21 DIAGNOSIS — Z79899 Other long term (current) drug therapy: Secondary | ICD-10-CM | POA: Diagnosis not present

## 2023-11-04 DIAGNOSIS — Z1231 Encounter for screening mammogram for malignant neoplasm of breast: Secondary | ICD-10-CM | POA: Diagnosis not present

## 2023-11-11 DIAGNOSIS — F312 Bipolar disorder, current episode manic severe with psychotic features: Secondary | ICD-10-CM | POA: Diagnosis not present

## 2024-06-02 ENCOUNTER — Encounter (HOSPITAL_COMMUNITY)

## 2024-06-02 ENCOUNTER — Other Ambulatory Visit (HOSPITAL_COMMUNITY)

## 2024-06-04 ENCOUNTER — Other Ambulatory Visit (HOSPITAL_COMMUNITY): Payer: Self-pay | Admitting: Ophthalmology

## 2024-06-04 DIAGNOSIS — H34211 Partial retinal artery occlusion, right eye: Secondary | ICD-10-CM

## 2024-06-09 ENCOUNTER — Ambulatory Visit (HOSPITAL_COMMUNITY)
Admission: RE | Admit: 2024-06-09 | Discharge: 2024-06-09 | Disposition: A | Discharge status: Home / Self Care | Source: Ambulatory Visit | Attending: Vascular Surgery | Admitting: Vascular Surgery

## 2024-06-09 ENCOUNTER — Ambulatory Visit (HOSPITAL_COMMUNITY)
Admission: RE | Admit: 2024-06-09 | Discharge: 2024-06-09 | Disposition: A | Discharge status: Home / Self Care | Source: Ambulatory Visit | Attending: Vascular Surgery

## 2024-06-09 ENCOUNTER — Other Ambulatory Visit (HOSPITAL_COMMUNITY): Payer: Self-pay | Admitting: Ophthalmology

## 2024-06-09 DIAGNOSIS — H34211 Partial retinal artery occlusion, right eye: Secondary | ICD-10-CM

## 2024-06-09 DIAGNOSIS — H34231 Retinal artery branch occlusion, right eye: Secondary | ICD-10-CM | POA: Insufficient documentation

## 2024-06-09 LAB — ECHOCARDIOGRAM COMPLETE
AR max vel: 3.04 cm2
AV Area VTI: 2.86 cm2
AV Area mean vel: 2.64 cm2
AV Mean grad: 3 mmHg
AV Peak grad: 6 mmHg
Ao pk vel: 1.22 m/s
Area-P 1/2: 2.38 cm2
S' Lateral: 2.04 cm
